# Patient Record
Sex: Male | Born: 1955 | Race: White | Hispanic: No | Marital: Married | Smoking: Never smoker
Health system: Southern US, Community
[De-identification: ages and names within clinical notes are randomized; demographics above are authoritative.]

## PROBLEM LIST (undated history)

## (undated) DIAGNOSIS — J45909 Unspecified asthma, uncomplicated: Secondary | ICD-10-CM

## (undated) HISTORY — PX: APPENDECTOMY: SHX54

## (undated) HISTORY — PX: ANKLE SURGERY: SHX546

---

## 2016-03-20 ENCOUNTER — Emergency Department (HOSPITAL_BASED_OUTPATIENT_CLINIC_OR_DEPARTMENT_OTHER): Payer: Managed Care, Other (non HMO)

## 2016-03-20 ENCOUNTER — Emergency Department (HOSPITAL_BASED_OUTPATIENT_CLINIC_OR_DEPARTMENT_OTHER)
Admission: EM | Admit: 2016-03-20 | Discharge: 2016-03-20 | Disposition: A | Discharge status: Other Acute Inpatient Hospital | Payer: Managed Care, Other (non HMO) | Attending: Emergency Medicine | Admitting: Emergency Medicine

## 2016-03-20 ENCOUNTER — Encounter (HOSPITAL_BASED_OUTPATIENT_CLINIC_OR_DEPARTMENT_OTHER): Payer: Self-pay | Admitting: Emergency Medicine

## 2016-03-20 DIAGNOSIS — I209 Angina pectoris, unspecified: Secondary | ICD-10-CM | POA: Diagnosis not present

## 2016-03-20 DIAGNOSIS — R0602 Shortness of breath: Secondary | ICD-10-CM

## 2016-03-20 DIAGNOSIS — J45909 Unspecified asthma, uncomplicated: Secondary | ICD-10-CM | POA: Insufficient documentation

## 2016-03-20 DIAGNOSIS — Z7982 Long term (current) use of aspirin: Secondary | ICD-10-CM | POA: Insufficient documentation

## 2016-03-20 DIAGNOSIS — R079 Chest pain, unspecified: Secondary | ICD-10-CM

## 2016-03-20 HISTORY — DX: Unspecified asthma, uncomplicated: J45.909

## 2016-03-20 LAB — TROPONIN I: Troponin I: 0.04 ng/mL — ABNORMAL HIGH (ref <0.031)

## 2016-03-20 LAB — CBC
HCT: 43.9 % (ref 39.0–52.0)
Hemoglobin: 15 g/dL (ref 13.0–17.0)
MCH: 32.3 pg (ref 26.0–34.0)
MCHC: 34.2 g/dL (ref 30.0–36.0)
MCV: 94.4 fL (ref 78.0–100.0)
Platelets: 220 10*3/uL (ref 150–400)
RBC: 4.65 MIL/uL (ref 4.22–5.81)
RDW: 12.4 % (ref 11.5–15.5)
WBC: 8.6 10*3/uL (ref 4.0–10.5)

## 2016-03-20 LAB — BASIC METABOLIC PANEL
Anion gap: 9 (ref 5–15)
BUN: 23 mg/dL — ABNORMAL HIGH (ref 6–20)
CO2: 28 mmol/L (ref 22–32)
Calcium: 9.2 mg/dL (ref 8.9–10.3)
Chloride: 102 mmol/L (ref 101–111)
Creatinine, Ser: 0.97 mg/dL (ref 0.61–1.24)
GFR calc Af Amer: >60 mL/min (ref >60)
GFR calc non Af Amer: >60 mL/min (ref >60)
Glucose, Bld: 110 mg/dL — ABNORMAL HIGH (ref 65–99)
Potassium: 3.5 mmol/L (ref 3.5–5.1)
Sodium: 139 mmol/L (ref 135–145)

## 2016-03-20 MED ORDER — ASPIRIN 81 MG PO CHEW: 324.0000 mg | CHEWABLE_TABLET | Freq: Once | ORAL | Status: DC

## 2016-03-20 MED ORDER — ASPIRIN 81 MG PO CHEW
162.0000 mg | CHEWABLE_TABLET | Freq: Once | ORAL | Status: AC
  Administered 2016-03-20: 162 mg via ORAL
  Filled 2016-03-20: qty 2

## 2016-03-20 NOTE — ED Provider Notes (Signed)
CSN: [id number]     Arrival date & time 03/20/16  1002 History   First MD Initiated Contact with Patient 03/20/16 1018     Chief Complaint  Patient presents with  . Chest Pain     (Consider location/radiation/quality/duration/timing/severity/associated sxs/prior Treatment) HPI Comments: 60 year old male who presents with chest pain. Yesterday, the patient was mowing his lawn when he began having central chest pain which he describes as a pressure sensation associated with left neck pain, shortness of breath, nausea, and diaphoresis. He sat down and rested and the pain resolved over 30 minutes. He returned to mowing his lawn and again had the same symptoms. His chest pain resolved again after resting. He states he has not had any chest pain or other symptoms since that time including no symptoms today. However, he became concerned about the symptoms he had yesterday which is why he presents now. He took 2 baby aspirin this morning. He states that he has had a history of exertional chest pain which always improves with rest. He denies any recent chest pain at rest. No recent travel, history of cancer, or history of blood clots. No family history of cardiac disease that patient is aware of.  Patient is a 60 y.o. male presenting with chest pain. The history is provided by the patient.  Chest Pain   Past Medical History  Diagnosis Date  . Asthma    Past Surgical History  Procedure Laterality Date  . Ankle surgery    . Appendectomy     No family history on file. Social History  Substance Use Topics  . Smoking status: Never Smoker   . Smokeless tobacco: None  . Alcohol Use: None    Review of Systems  Cardiovascular: Positive for chest pain.   10 Systems reviewed and are negative for acute change except as noted in the HPI.    Allergies  Review of patient's allergies indicates no known allergies.  Home Medications   Prior to Admission medications   Medication Sig Start Date End  Date Taking? Authorizing Provider  aspirin 81 MG tablet Take 81 mg by mouth daily.   Yes Historical Provider, MD   BP 129/70 mmHg  Pulse 84  Temp(Src) 98.2 F (36.8 C) (Oral)  Resp 12  Ht  (1.702 m)  Wt 239 lb 14.4 oz (108.818 kg)  BMI 37.56 kg/m2  SpO2 98% Physical Exam  Constitutional: He is oriented to person, place, and time. He appears well-developed and well-nourished. No distress.  HENT:  Head: Normocephalic and atraumatic.  Moist mucous membranes  Eyes: Conjunctivae are normal. Pupils are equal, round, and reactive to light.  Neck: Neck supple.  Cardiovascular: Normal rate, regular rhythm and normal heart sounds.   No murmur heard. Pulmonary/Chest: Effort normal and breath sounds normal.  Abdominal: Soft. Bowel sounds are normal. He exhibits no distension. There is no tenderness.  Musculoskeletal: He exhibits no edema.  Neurological: He is alert and oriented to person, place, and time.  Fluent speech  Skin: Skin is warm and dry.  Psychiatric: He has a normal mood and affect. Judgment normal.  Nursing note and vitals reviewed.   ED Course  Procedures (including critical care time) Labs Review Labs Reviewed  BASIC METABOLIC PANEL - Abnormal; Notable for the following:    Glucose, Bld 110 (*)    BUN 23 (*)    All other components within normal limits  TROPONIN I - Abnormal; Notable for the following:    Troponin I 0.04 (*)  All other components within normal limits  CBC    Imaging Review Dg Chest 2 View  03/20/2016  CLINICAL DATA:  Chest pain and nausea. EXAM: CHEST  2 VIEW COMPARISON:  None. FINDINGS: Cardiomediastinal silhouette is normal. Mediastinal contours appear intact. There is no evidence of focal airspace consolidation, pleural effusion or pneumothorax. Osseous structures are without acute abnormality. Stigmata of diffuse idiopathic skeletal hyperostosis is noted. Soft tissues are grossly normal. IMPRESSION: No active cardiopulmonary disease.  Electronically Signed   By: Ted Mcalpineobrinka  Dimitrova M.D.   On: 03/20/2016 11:01   I have personally reviewed and evaluated these lab results as part of my medical decision-making.   EKG Interpretation   Date/Time:  Monday Mar 20 2016 10:08:27 EDT Ventricular Rate:  81 PR Interval:  162 QRS Duration: 68 QT Interval:  354 QTC Calculation: 411 R Axis:   54 Text Interpretation:  Normal sinus rhythm Low voltage QRS Cannot rule out  Anterior infarct , age undetermined Abnormal ECG No previous ECGs  available Confirmed by Sevrin Sally MD, Kajal Scalici [phone] on 03/20/2016 10:18:52 AM  Also confirmed by Jakota Manthei MD, Charmagne Buhl [phone]), editor Whitney PostLOGAN, Cala BradfordKIMBERLY  [phone])  on 03/20/2016 10:50:23 AM     Medications  aspirin chewable tablet 162 mg (162 mg Oral Given 03/20/16 1126)    MDM   Final diagnoses:  None    Pt p/w an episode of exertional chest pain associated w/ nausea, diaphoresis, and SOB that Proved with rest and reoccurred after exerting himself again yesterday. Patient has been chest pain-free today. On exam, he was well-appearing. Vital signs notable for mild hypertension. EKG shows sinus rhythm without obvious ischemic changes, no previous available for comparison. Chest x-ray unremarkable. Gave the patient 2 more baby aspirin. Obtained above lab work which was notable for borderline troponin I at 0.04. I concerned about the exertional quality of his chest pain as well as the associated symptoms as concerning for ACS. I discussed with cardiologist at [facility], Dr. Deno Etiennehu, who recommended medicine admission for further workup of his chest pain. Discussed with internal medicine, Dr. Jeanice LimMahadavapa, who will accept patient in transfer. Pt in agreement with plan and chest pain free on re-examination. Transferred to [facility] for further care.  Laurence Spatesachel Morgan Aaminah Forrester, MD 03/20/16 [phone]

## 2016-03-20 NOTE — ED Notes (Signed)
Centralized chest pain radiating to left neck occurred yesterday while mowing the lawn.  Pt states he rested, pain improved and started again when he started to mow again.  Pt is pain free currently.  Pt has some sob, some nausea, and diaphoresis during the event.  Denies any extended travel or immobility.

## 2017-07-24 IMAGING — DX DG CHEST 2V
2 series · 2 of 2 positions shown · non-contrast
Comparison: None.

CLINICAL DATA: Chest pain and nausea.

EXAM:
CHEST  2 VIEW

[chest pa]
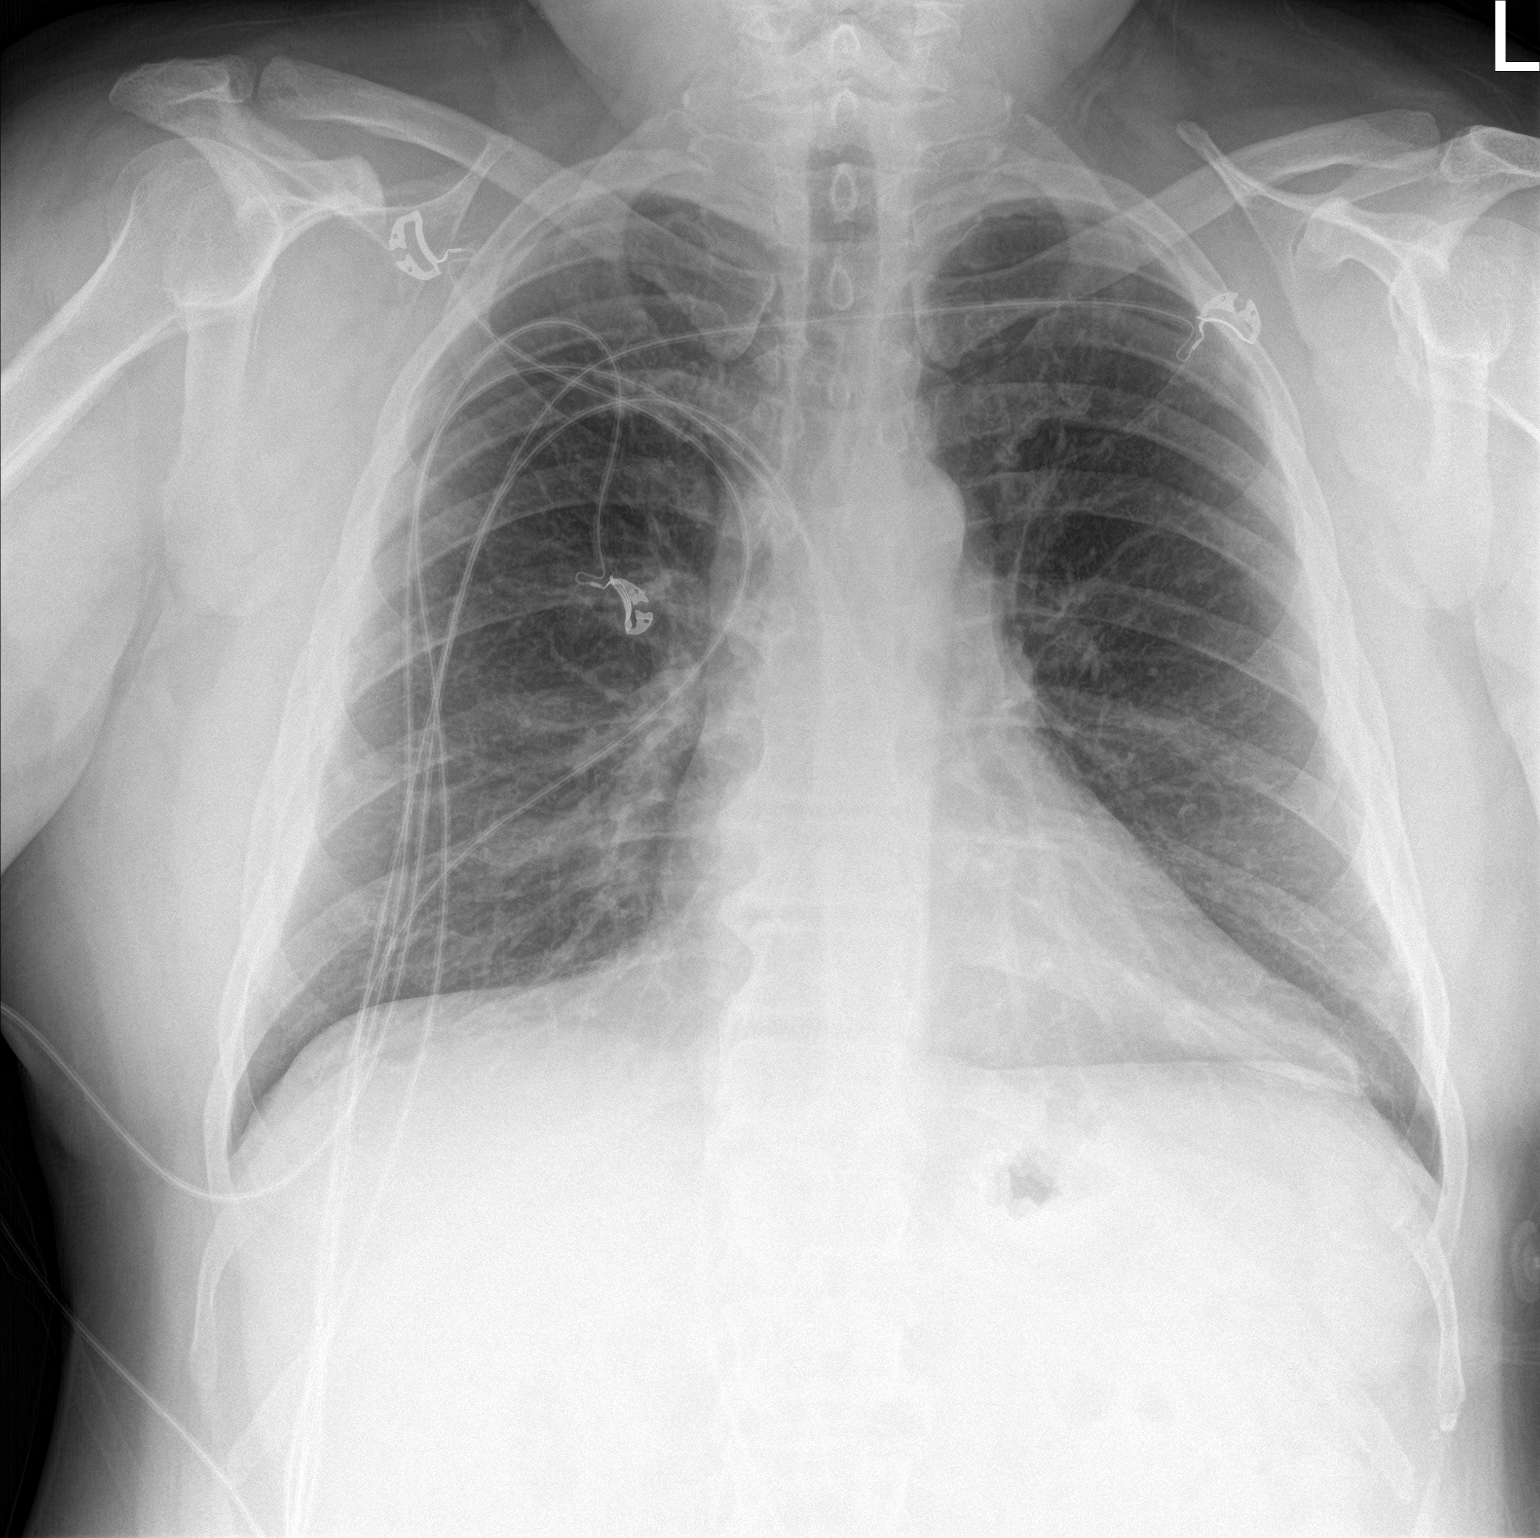

[chest lat]
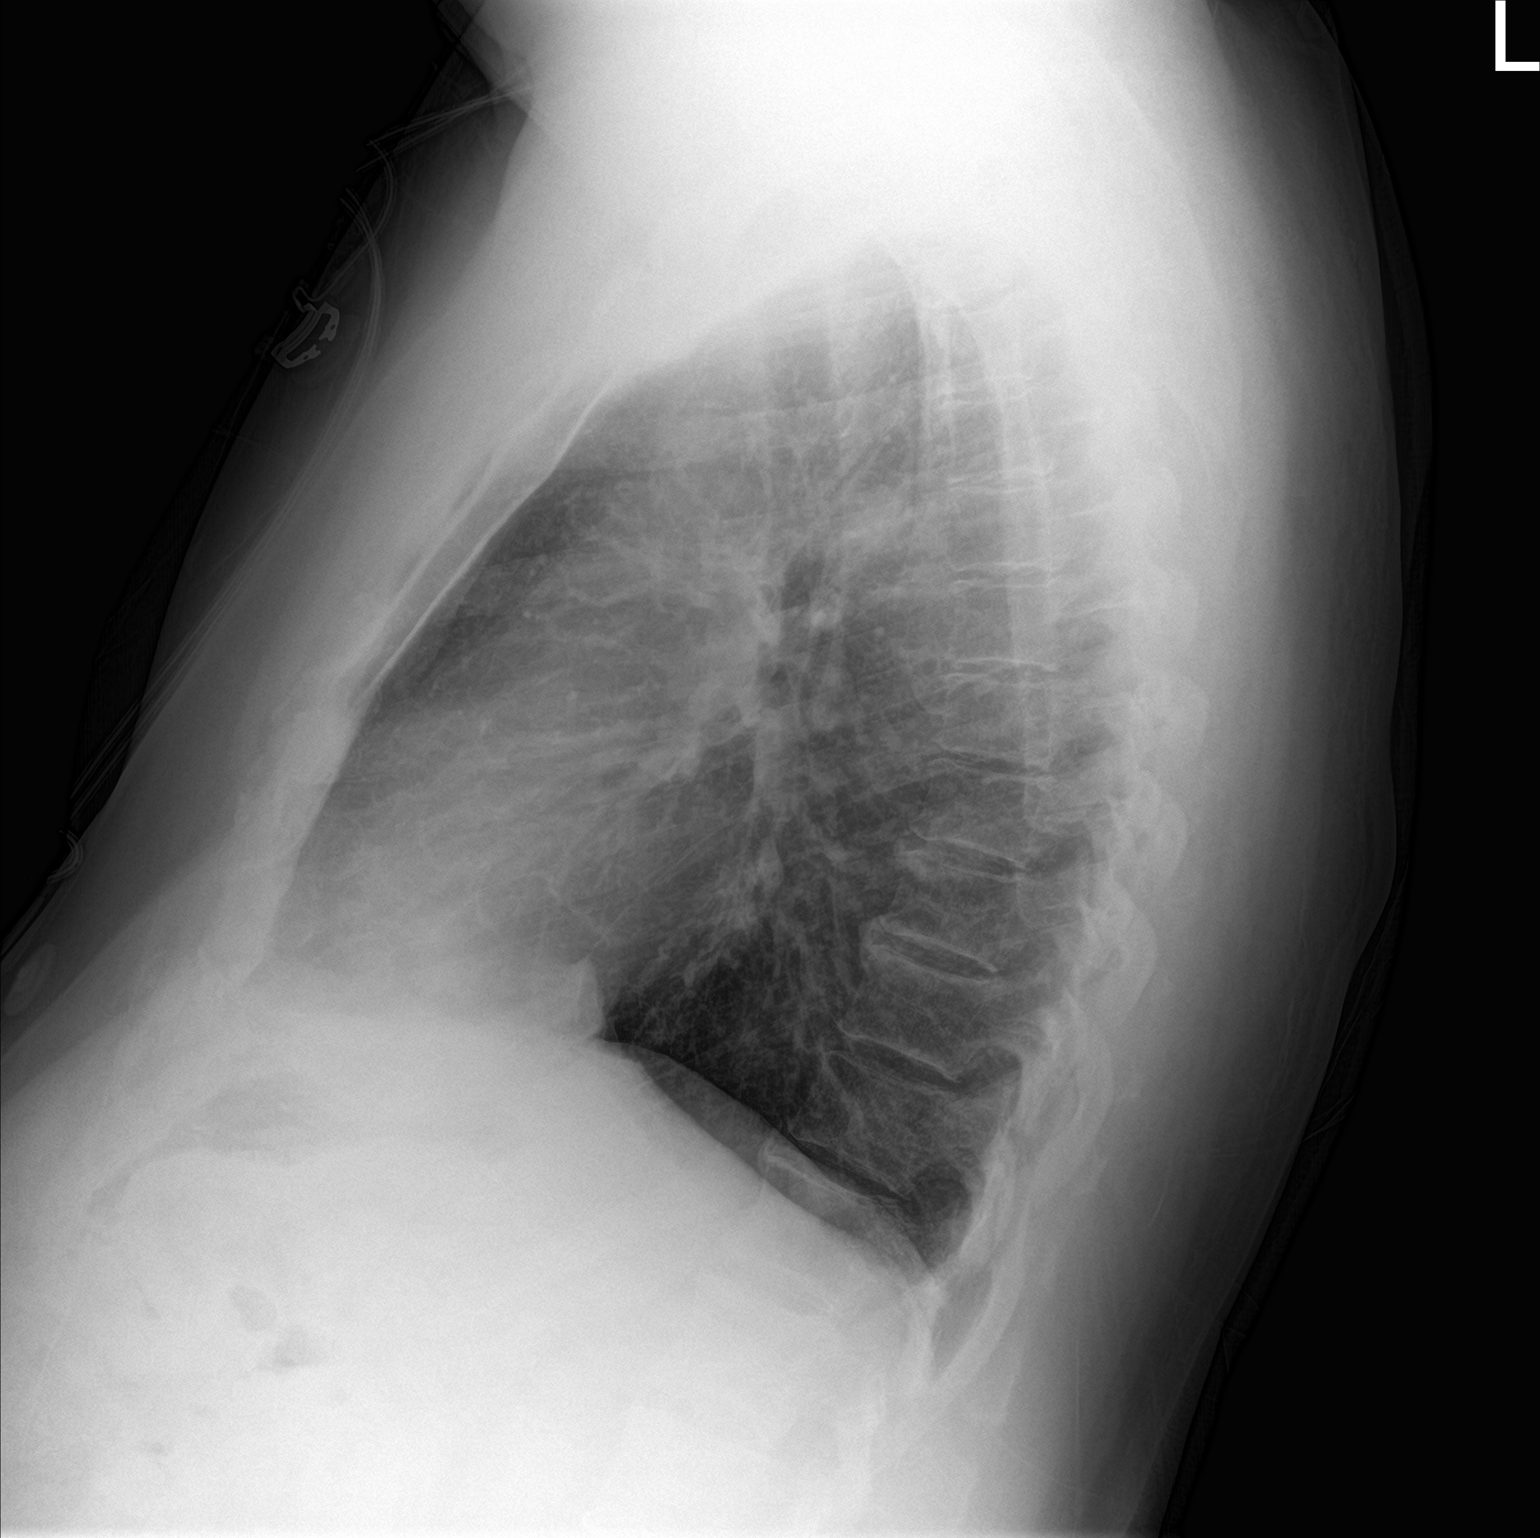

[2 of 2 positions shown; findings below may reference images not displayed]

FINDINGS: Cardiomediastinal silhouette is normal. Mediastinal contours appear
intact.

There is no evidence of focal airspace consolidation, pleural
effusion or pneumothorax.

Osseous structures are without acute abnormality. Stigmata of
diffuse idiopathic skeletal hyperostosis is noted. Soft tissues are
grossly normal.
IMPRESSION: No active cardiopulmonary disease.
